# Patient Record
Sex: Female | Born: 1975 | Race: White | Hispanic: No | Marital: Married | Smoking: Former smoker
Health system: Southern US, Community
[De-identification: ages and names within clinical notes are randomized; demographics above are authoritative.]

## PROBLEM LIST (undated history)

## (undated) DIAGNOSIS — F419 Anxiety disorder, unspecified: Secondary | ICD-10-CM

## (undated) HISTORY — DX: Anxiety disorder, unspecified: F41.9

## (undated) HISTORY — PX: BREAST CYST ASPIRATION: SHX578

---

## 1999-09-24 ENCOUNTER — Ambulatory Visit (HOSPITAL_BASED_OUTPATIENT_CLINIC_OR_DEPARTMENT_OTHER): Admission: RE | Admit: 1999-09-24 | Discharge: 1999-09-24 | Payer: Self-pay

## 1999-09-24 ENCOUNTER — Encounter (INDEPENDENT_AMBULATORY_CARE_PROVIDER_SITE_OTHER): Payer: Self-pay | Admitting: *Deleted

## 2001-02-07 ENCOUNTER — Other Ambulatory Visit: Admission: RE | Admit: 2001-02-07 | Discharge: 2001-02-07 | Payer: Self-pay | Admitting: Obstetrics & Gynecology

## 2002-03-14 ENCOUNTER — Other Ambulatory Visit: Admission: RE | Admit: 2002-03-14 | Discharge: 2002-03-14 | Payer: Self-pay | Admitting: Obstetrics & Gynecology

## 2003-03-17 ENCOUNTER — Other Ambulatory Visit: Admission: RE | Admit: 2003-03-17 | Discharge: 2003-03-17 | Payer: Self-pay | Admitting: Obstetrics & Gynecology

## 2004-04-13 ENCOUNTER — Other Ambulatory Visit: Admission: RE | Admit: 2004-04-13 | Discharge: 2004-04-13 | Payer: Self-pay | Admitting: Obstetrics & Gynecology

## 2005-04-27 ENCOUNTER — Other Ambulatory Visit: Admission: RE | Admit: 2005-04-27 | Discharge: 2005-04-27 | Payer: Self-pay | Admitting: Obstetrics & Gynecology

## 2005-10-03 ENCOUNTER — Ambulatory Visit: Payer: Self-pay | Admitting: Family Medicine

## 2006-06-28 ENCOUNTER — Ambulatory Visit: Payer: Self-pay | Admitting: Family Medicine

## 2006-08-14 ENCOUNTER — Ambulatory Visit (HOSPITAL_COMMUNITY): Payer: Self-pay | Admitting: Psychiatry

## 2007-06-01 ENCOUNTER — Encounter: Admission: RE | Admit: 2007-06-01 | Discharge: 2007-06-01 | Payer: Self-pay | Admitting: Obstetrics & Gynecology

## 2007-11-29 ENCOUNTER — Encounter: Admission: RE | Admit: 2007-11-29 | Discharge: 2007-11-29 | Payer: Self-pay | Admitting: Obstetrics & Gynecology

## 2008-05-06 ENCOUNTER — Ambulatory Visit: Payer: Self-pay | Admitting: Family Medicine

## 2008-05-06 DIAGNOSIS — F411 Generalized anxiety disorder: Secondary | ICD-10-CM | POA: Insufficient documentation

## 2008-06-06 ENCOUNTER — Encounter: Admission: RE | Admit: 2008-06-06 | Discharge: 2008-06-06 | Payer: Self-pay | Admitting: Obstetrics & Gynecology

## 2008-06-27 ENCOUNTER — Telehealth (INDEPENDENT_AMBULATORY_CARE_PROVIDER_SITE_OTHER): Payer: Self-pay | Admitting: *Deleted

## 2012-02-01 ENCOUNTER — Ambulatory Visit: Payer: Managed Care, Other (non HMO) | Admitting: Family Medicine

## 2012-02-01 VITALS — BP 112/68 | HR 92 | Temp 98.8°F | Resp 14 | Ht 63.0 in | Wt 128.0 lb

## 2012-02-01 DIAGNOSIS — R21 Rash and other nonspecific skin eruption: Secondary | ICD-10-CM

## 2012-02-01 DIAGNOSIS — N949 Unspecified condition associated with female genital organs and menstrual cycle: Secondary | ICD-10-CM

## 2012-02-01 DIAGNOSIS — L259 Unspecified contact dermatitis, unspecified cause: Secondary | ICD-10-CM

## 2012-02-01 LAB — POCT SKIN KOH: Skin KOH, POC: NEGATIVE

## 2012-02-01 MED ORDER — TRIAMCINOLONE ACETONIDE 0.1 % EX CREA: TOPICAL_CREAM | Freq: Two times a day (BID) | CUTANEOUS | Status: AC

## 2012-02-01 NOTE — Progress Notes (Signed)
Subjective:    Patient ID: Alyssa Obrien, female    DOB: Nov 11, 1975, 36 y.o.   MRN: [id number]  HPIThis 36 y.o. female presents for evaluation of rash along anterior neck.  Onset two weeks ago.  Very stressful three weeks.  Looked like hives at first; now seems to be spreading from neck to facial region.  Intermittent itching.  Heat or lotion makes worse.  Benadryl crea, moisturizer makes worse.  Astringent does not bother it.  No other areas of involvement.  No new sunscreen.  Started with single dry spot.  Then large spots with pustules.  No blisters.  No pain or tenderness.  Feels very dry.  Will puff up.  History of hives but this rash is different; history of Monistat cream use with development of hives.  Several episodes of hives in childhood due to stress per patient.  No new soaps, detergents, shampoos, fabric softener.  No new medications.  No recent illnesses; no tick bites.  No close contacts.  No steroid cream use.  PMH: Anxiety. All: Monistat cream.        Review of Systems  Constitutional: Negative for fever, chills and fatigue.  HENT: Negative for ear pain, congestion, sore throat, rhinorrhea and mouth sores.   Respiratory: Negative for cough.   Gastrointestinal: Negative for nausea and vomiting.  Skin: Positive for rash. Negative for pallor and wound.  Psychiatric/Behavioral: The patient is nervous/anxious.     Past Medical History  Diagnosis Date  . Anxiety     No past surgical history on file.  Prior to Admission medications   Medication Sig Start Date End Date Taking? Authorizing Provider  ALPRAZolam Prudy Feeler) 0.5 MG tablet Take 0.5 mg by mouth at bedtime as needed.   Yes Historical Provider, MD  norgestimate-ethinyl estradiol (ORTHO-CYCLEN,SPRINTEC,PREVIFEM) 0.25-35 MG-MCG tablet Take 1 tablet by mouth daily.   Yes Historical Provider, MD  triamcinolone cream (KENALOG) 0.1 % Apply topically 2 (two) times daily. 02/01/12 01/31/13  Ethelda Chick, MD    No Known  Allergies  History   Social History  . Marital Status: Married    Spouse Name: N/A    Number of Children: N/A  . Years of Education: N/A   Occupational History  . Not on file.   Social History Main Topics  . Smoking status: Former Games developer  . Smokeless tobacco: Not on file  . Alcohol Use: Not on file  . Drug Use: Not on file  . Sexually Active: Not on file   Other Topics Concern  . Not on file   Social History Narrative  . No narrative on file    No family history on file.     Objective:   Physical Exam  Constitutional: She appears well-developed and well-nourished. No distress.  HENT:  Head: Normocephalic and atraumatic.  Eyes: Conjunctivae and EOM are normal. Pupils are equal, round, and reactive to light.  Neck: Normal range of motion. Neck supple. No thyromegaly present.  Cardiovascular: Normal rate and regular rhythm.   Pulmonary/Chest: Effort normal and breath sounds normal.  Lymphadenopathy:    She has no cervical adenopathy.  Skin: Skin is warm and dry. Rash noted. She is not diaphoretic. No pallor.       Anterior neck with scaling rash multiple areas; scattered annular rash with some central clearing; minimal erythema.  Scattered lesions anterior neck; 3 small similar lesions chin, upper lip.  No vesicles or pustules.  Non-tender.  No warmth, induration, streaking.  Psychiatric: She has a  normal mood and affect. Her behavior is normal. Judgment and thought content normal.          Results for orders placed in visit on 02/01/12  POCT SKIN KOH      Component Value Range   Skin KOH, POC Negative     Assessment & Plan:   1. Rash of neck  POCT Skin KOH  2. Dermatitis, contact     New.  Pruritix scaling rash anterior neck.  KOH negative.  Rx for Triamcinolone cream provided to apply bid to tid for next 2-3 weeks; to call office if clinically declines/worsens or spreads.  Local wound care.  Recommend Claritin or Zyrtec 10mg  daily for itching. Meds ordered  this encounter  Medications  . norgestimate-ethinyl estradiol (ORTHO-CYCLEN,SPRINTEC,PREVIFEM) 0.25-35 MG-MCG tablet    Sig: Take 1 tablet by mouth daily.  Marland Kitchen ALPRAZolam (XANAX) 0.5 MG tablet    Sig: Take 0.5 mg by mouth at bedtime as needed.  . triamcinolone cream (KENALOG) 0.1 %    Sig: Apply topically 2 (two) times daily.    Dispense:  30 g    Refill:  0

## 2012-02-01 NOTE — Patient Instructions (Addendum)
1. Rash of neck  POCT Skin KOH  2. Dermatitis, contact    Contact Dermatitis Contact dermatitis is a reaction to certain substances that touch the skin. Contact dermatitis can be either irritant contact dermatitis or allergic contact dermatitis. Irritant contact dermatitis does not require previous exposure to the substance for a reaction to occur.Allergic contact dermatitis only occurs if you have been exposed to the substance before. Upon a repeat exposure, your body reacts to the substance.  CAUSES  Many substances can cause contact dermatitis. Irritant dermatitis is most commonly caused by repeated exposure to mildly irritating substances, such as:  Makeup.   Soaps.   Detergents.   Bleaches.   Acids.   Metal salts, such as nickel.  Allergic contact dermatitis is most commonly caused by exposure to:  Poisonous plants.   Chemicals (deodorants, shampoos).   Jewelry.   Latex.   Neomycin in triple antibiotic cream.   Preservatives in products, including clothing.  SYMPTOMS  The area of skin that is exposed may develop:  Dryness or flaking.   Redness.   Cracks.   Itching.   Pain or a burning sensation.   Blisters.  With allergic contact dermatitis, there may also be swelling in areas such as the eyelids, mouth, or genitals.  DIAGNOSIS  Your caregiver can usually tell what the problem is by doing a physical exam. In cases where the cause is uncertain and an allergic contact dermatitis is suspected, a patch skin test may be performed to help determine the cause of your dermatitis. TREATMENT Treatment includes protecting the skin from further contact with the irritating substance by avoiding that substance if possible. Barrier creams, powders, and gloves may be helpful. Your caregiver may also recommend:  Steroid creams or ointments applied 2 times daily. For best results, soak the rash area in cool water for 20 minutes. Then apply the medicine. Cover the area with a  plastic wrap. You can store the steroid cream in the refrigerator for a "chilly" effect on your rash. That may decrease itching. Oral steroid medicines may be needed in more severe cases.   Antibiotics or antibacterial ointments if a skin infection is present.   Antihistamine lotion or an antihistamine taken by mouth to ease itching.   Lubricants to keep moisture in your skin.   Burow's solution to reduce redness and soreness or to dry a weeping rash. Mix one packet or tablet of solution in 2 cups cool water. Dip a clean washcloth in the mixture, wring it out a bit, and put it on the affected area. Leave the cloth in place for 30 minutes. Do this as often as possible throughout the day.   Taking several cornstarch or baking soda baths daily if the area is too large to cover with a washcloth.  Harsh chemicals, such as alkalis or acids, can cause skin damage that is like a burn. You should flush your skin for 15 to 20 minutes with cold water after such an exposure. You should also seek immediate medical care after exposure. Bandages (dressings), antibiotics, and pain medicine may be needed for severely irritated skin.  HOME CARE INSTRUCTIONS  Avoid the substance that caused your reaction.   Keep the area of skin that is affected away from hot water, soap, sunlight, chemicals, acidic substances, or anything else that would irritate your skin.   Do not scratch the rash. Scratching may cause the rash to become infected.   You may take cool baths to help stop the itching.  Only take over-the-counter or prescription medicines as directed by your caregiver.   See your caregiver for follow-up care as directed to make sure your skin is healing properly.  SEEK MEDICAL CARE IF:   Your condition is not better after 3 days of treatment.   You seem to be getting worse.   You see signs of infection such as swelling, tenderness, redness, soreness, or warmth in the affected area.   You have any  problems related to your medicines.  Document Released: 06/03/2000 Document Revised: 05/26/2011 Document Reviewed: 11/09/2010 [facility] Patient Information 2012 [place], [place].

## 2012-02-05 ENCOUNTER — Encounter: Payer: Self-pay | Admitting: Family Medicine

## 2012-02-10 NOTE — Progress Notes (Signed)
Reviewed and agree.

## 2013-09-04 ENCOUNTER — Other Ambulatory Visit: Payer: Self-pay | Admitting: Obstetrics & Gynecology

## 2013-09-04 DIAGNOSIS — R928 Other abnormal and inconclusive findings on diagnostic imaging of breast: Secondary | ICD-10-CM

## 2013-09-16 ENCOUNTER — Other Ambulatory Visit: Payer: Self-pay | Admitting: Obstetrics & Gynecology

## 2013-09-16 ENCOUNTER — Ambulatory Visit
Admission: RE | Admit: 2013-09-16 | Discharge: 2013-09-16 | Disposition: A | Discharge status: Home / Self Care | Payer: Managed Care, Other (non HMO) | Source: Ambulatory Visit | Attending: Obstetrics & Gynecology | Admitting: Obstetrics & Gynecology

## 2013-09-16 DIAGNOSIS — N63 Unspecified lump in unspecified breast: Secondary | ICD-10-CM

## 2013-09-16 DIAGNOSIS — R928 Other abnormal and inconclusive findings on diagnostic imaging of breast: Secondary | ICD-10-CM

## 2013-09-20 ENCOUNTER — Other Ambulatory Visit: Payer: Self-pay | Admitting: Obstetrics & Gynecology

## 2013-09-20 ENCOUNTER — Ambulatory Visit
Admission: RE | Admit: 2013-09-20 | Discharge: 2013-09-20 | Disposition: A | Discharge status: Home / Self Care | Payer: Managed Care, Other (non HMO) | Source: Ambulatory Visit | Attending: Obstetrics & Gynecology | Admitting: Obstetrics & Gynecology

## 2013-09-20 DIAGNOSIS — N6001 Solitary cyst of right breast: Secondary | ICD-10-CM

## 2013-09-20 DIAGNOSIS — N63 Unspecified lump in unspecified breast: Secondary | ICD-10-CM

## 2013-10-01 ENCOUNTER — Other Ambulatory Visit: Payer: Self-pay | Admitting: Dermatology

## 2014-10-08 ENCOUNTER — Other Ambulatory Visit: Payer: Self-pay | Admitting: Obstetrics & Gynecology

## 2014-10-10 ENCOUNTER — Other Ambulatory Visit: Payer: Self-pay | Admitting: Obstetrics & Gynecology

## 2014-10-10 DIAGNOSIS — R928 Other abnormal and inconclusive findings on diagnostic imaging of breast: Secondary | ICD-10-CM

## 2014-10-10 LAB — CYTOLOGY - PAP

## 2014-10-21 ENCOUNTER — Ambulatory Visit
Admission: RE | Admit: 2014-10-21 | Discharge: 2014-10-21 | Disposition: A | Discharge status: Home / Self Care | Payer: Managed Care, Other (non HMO) | Source: Ambulatory Visit | Attending: Obstetrics & Gynecology | Admitting: Obstetrics & Gynecology

## 2014-10-21 DIAGNOSIS — R928 Other abnormal and inconclusive findings on diagnostic imaging of breast: Secondary | ICD-10-CM

## 2015-10-27 ENCOUNTER — Other Ambulatory Visit: Payer: Self-pay | Admitting: Obstetrics & Gynecology

## 2015-10-27 DIAGNOSIS — R928 Other abnormal and inconclusive findings on diagnostic imaging of breast: Secondary | ICD-10-CM

## 2015-11-04 ENCOUNTER — Ambulatory Visit
Admission: RE | Admit: 2015-11-04 | Discharge: 2015-11-04 | Disposition: A | Discharge status: Home / Self Care | Payer: Managed Care, Other (non HMO) | Source: Ambulatory Visit | Attending: Obstetrics & Gynecology | Admitting: Obstetrics & Gynecology

## 2015-11-04 DIAGNOSIS — R928 Other abnormal and inconclusive findings on diagnostic imaging of breast: Secondary | ICD-10-CM

## 2019-05-27 ENCOUNTER — Other Ambulatory Visit: Payer: Self-pay | Admitting: Obstetrics & Gynecology

## 2019-05-27 DIAGNOSIS — R928 Other abnormal and inconclusive findings on diagnostic imaging of breast: Secondary | ICD-10-CM

## 2019-06-04 ENCOUNTER — Other Ambulatory Visit: Payer: Self-pay

## 2019-06-04 ENCOUNTER — Other Ambulatory Visit: Payer: Self-pay | Admitting: Obstetrics & Gynecology

## 2019-06-04 ENCOUNTER — Ambulatory Visit
Admission: RE | Admit: 2019-06-04 | Discharge: 2019-06-04 | Disposition: A | Discharge status: Home / Self Care | Payer: 59 | Source: Ambulatory Visit | Attending: Obstetrics & Gynecology | Admitting: Obstetrics & Gynecology

## 2019-06-04 ENCOUNTER — Ambulatory Visit
Admission: RE | Admit: 2019-06-04 | Discharge: 2019-06-04 | Disposition: A | Discharge status: Home / Self Care | Payer: Managed Care, Other (non HMO) | Source: Ambulatory Visit | Attending: Obstetrics & Gynecology | Admitting: Obstetrics & Gynecology

## 2019-06-04 DIAGNOSIS — N6310 Unspecified lump in the right breast, unspecified quadrant: Secondary | ICD-10-CM

## 2019-06-04 DIAGNOSIS — R928 Other abnormal and inconclusive findings on diagnostic imaging of breast: Secondary | ICD-10-CM

## 2019-12-04 ENCOUNTER — Ambulatory Visit
Admission: RE | Admit: 2019-12-04 | Discharge: 2019-12-04 | Disposition: A | Discharge status: Home / Self Care | Payer: 59 | Source: Ambulatory Visit | Attending: Obstetrics & Gynecology | Admitting: Obstetrics & Gynecology

## 2019-12-04 ENCOUNTER — Other Ambulatory Visit: Payer: Self-pay

## 2019-12-04 ENCOUNTER — Other Ambulatory Visit: Payer: Self-pay | Admitting: Obstetrics & Gynecology

## 2019-12-04 DIAGNOSIS — N6310 Unspecified lump in the right breast, unspecified quadrant: Secondary | ICD-10-CM

## 2020-06-05 ENCOUNTER — Other Ambulatory Visit: Payer: Self-pay

## 2020-06-05 ENCOUNTER — Ambulatory Visit
Admission: RE | Admit: 2020-06-05 | Discharge: 2020-06-05 | Disposition: A | Discharge status: Home / Self Care | Payer: No Typology Code available for payment source | Source: Ambulatory Visit | Attending: Obstetrics & Gynecology | Admitting: Obstetrics & Gynecology

## 2020-06-05 DIAGNOSIS — N6310 Unspecified lump in the right breast, unspecified quadrant: Secondary | ICD-10-CM

## 2021-09-10 ENCOUNTER — Other Ambulatory Visit: Payer: Self-pay | Admitting: Obstetrics & Gynecology

## 2021-09-10 DIAGNOSIS — N6310 Unspecified lump in the right breast, unspecified quadrant: Secondary | ICD-10-CM

## 2021-09-27 ENCOUNTER — Ambulatory Visit
Admission: RE | Admit: 2021-09-27 | Discharge: 2021-09-27 | Disposition: A | Discharge status: Home / Self Care | Payer: No Typology Code available for payment source | Source: Ambulatory Visit | Attending: Obstetrics & Gynecology | Admitting: Obstetrics & Gynecology

## 2021-09-27 DIAGNOSIS — N6310 Unspecified lump in the right breast, unspecified quadrant: Secondary | ICD-10-CM

## 2022-05-24 IMAGING — MG DIGITAL DIAGNOSTIC BILAT W/ TOMO W/ CAD
6 of 10 series · 6 of 30 positions shown · non-contrast
Comparison: Previous exam(s).

CLINICAL DATA: Follow-up probably benign mass in the 10 o'clock
position of the right breast. History of benign breast cysts.

EXAM:
DIGITAL DIAGNOSTIC BILATERAL MAMMOGRAM WITH TOMOSYNTHESIS AND CAD;
ULTRASOUND RIGHT BREAST LIMITED
TECHNIQUE: Bilateral digital diagnostic mammography and breast tomosynthesis
was performed. The images were evaluated with computer-aided
detection.; Targeted ultrasound examination of the right breast was
performed

[R TAN synth-2D]
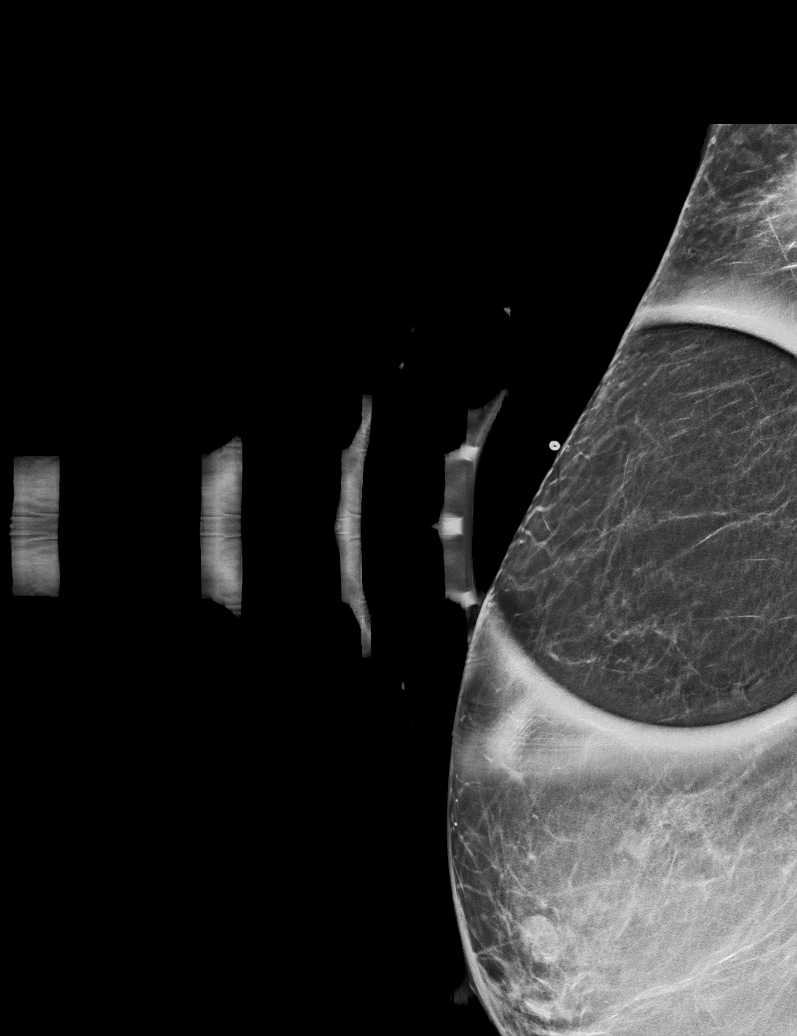

[R CC synth-2D]
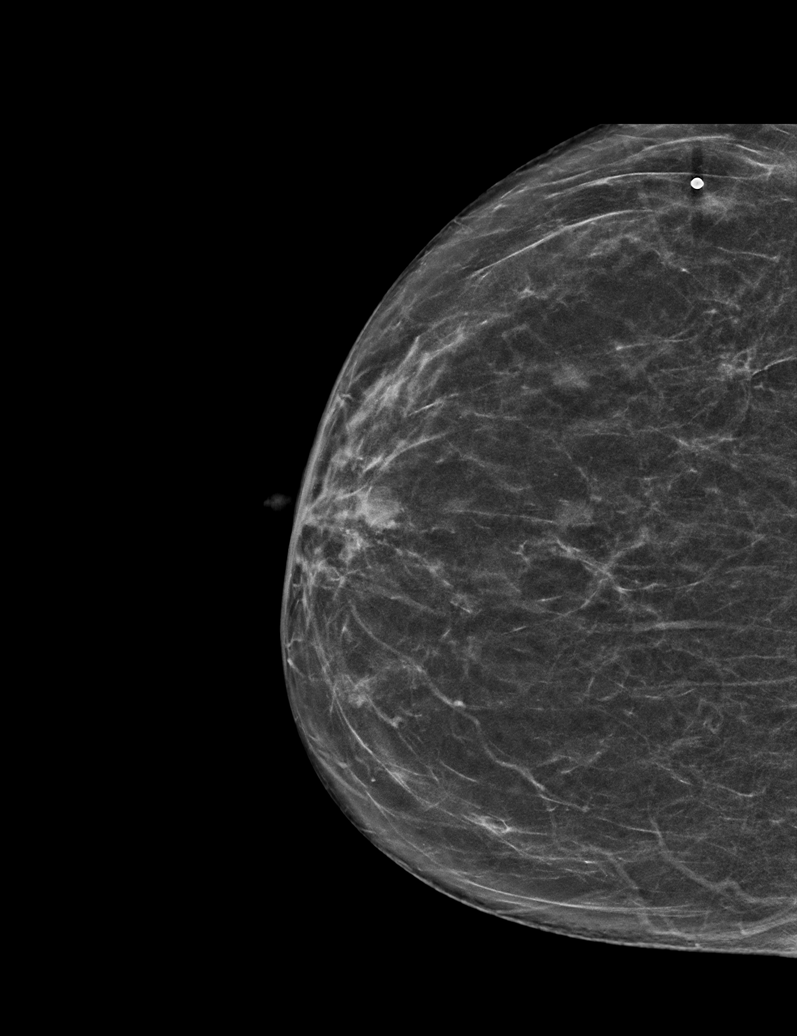

[L MLO synth-2D]
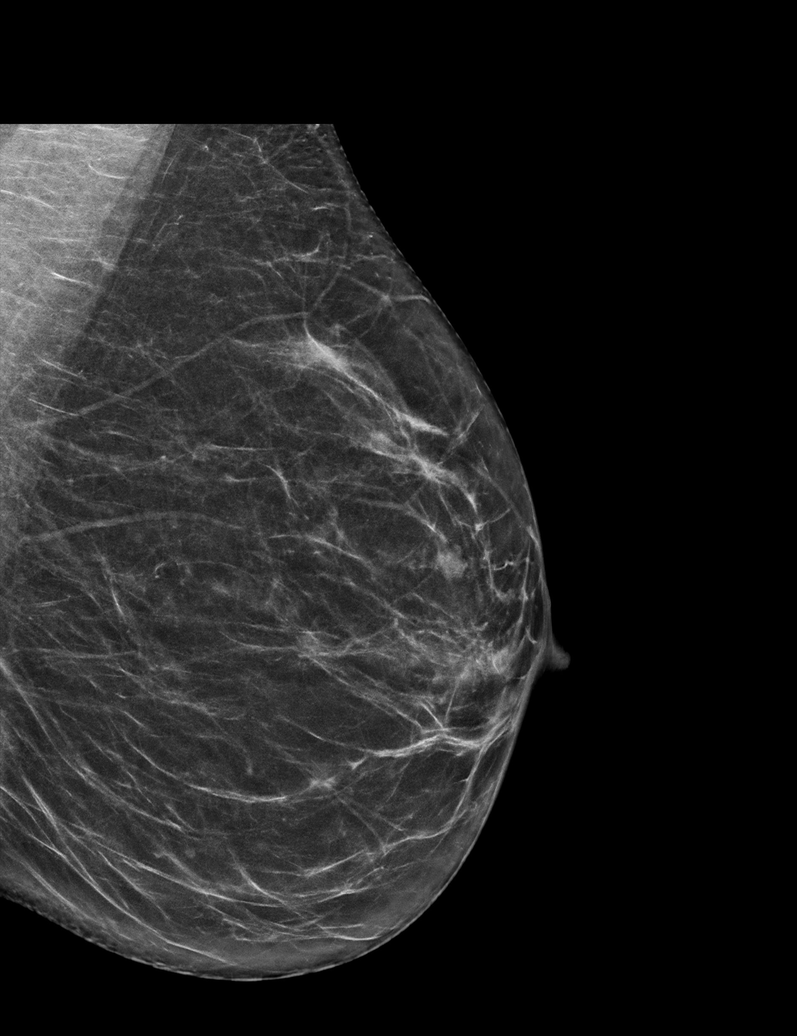

[L CC synth-2D]
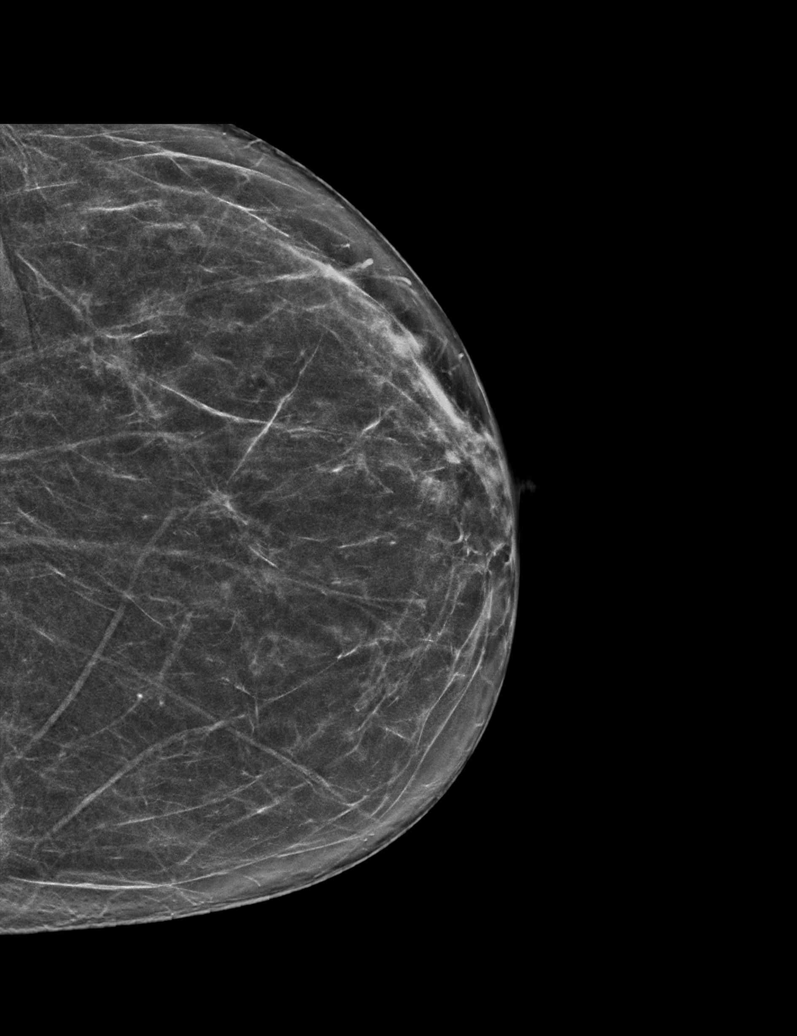

[R MLO synth-2D]
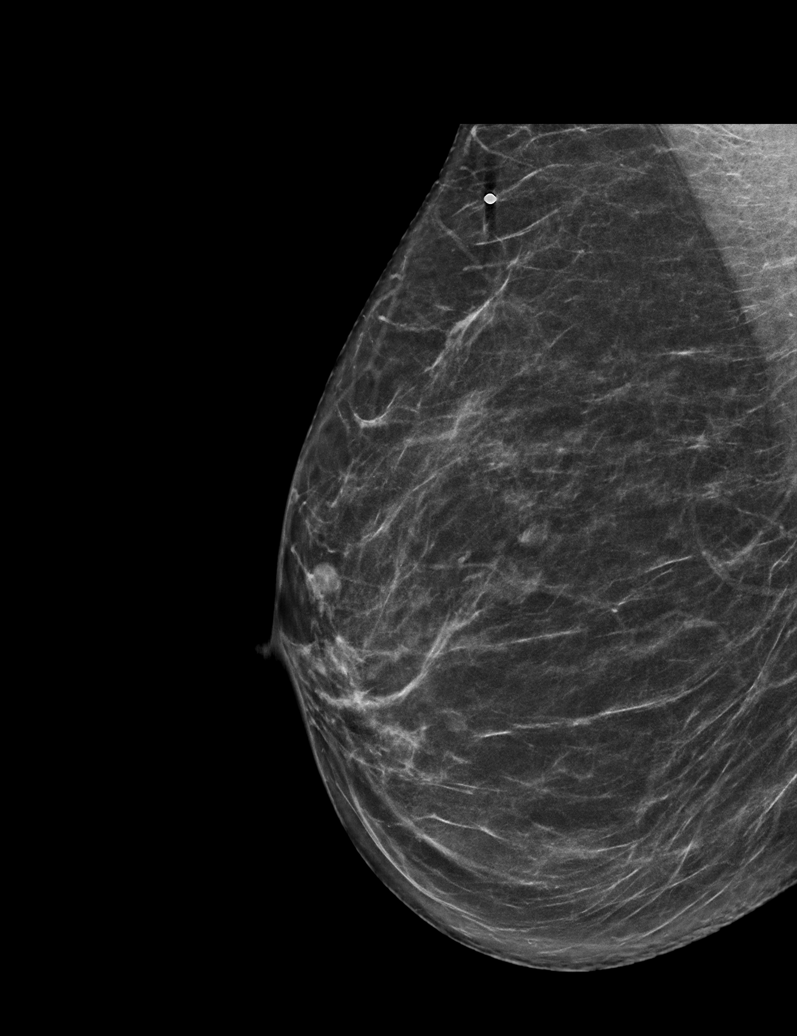

[R TAN tomo · tomo slice 29/56.0]
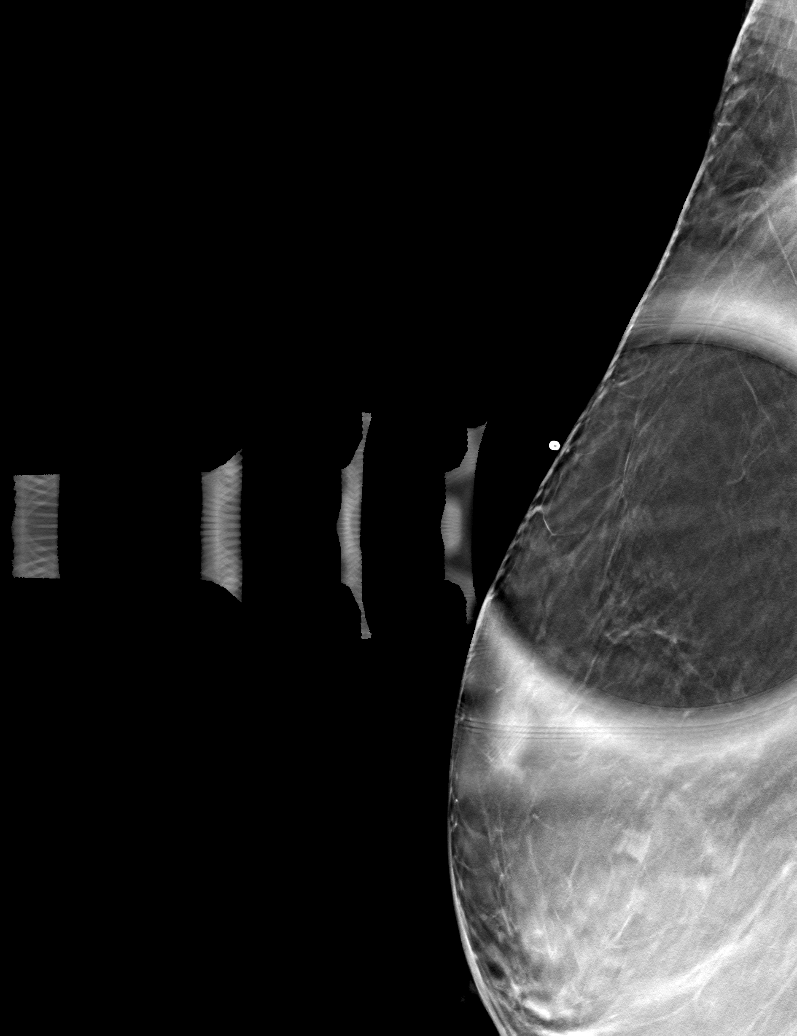

[6 of 30 positions shown; findings below may reference images not displayed]

ACR Breast Density Category b: There are scattered areas of
fibroglandular density.
FINDINGS: Multiple small, oval, circumscribed waxing and waning masses are
again demonstrated in both breasts. One of these, previously
demonstrated in the posterior upper outer right breast is no longer
visualized. No mass or other findings suspicious for malignancy at
the location of patient palpable concern on the right, marked with a
metallic marker. No interval findings elsewhere in either breast
suspicious for malignancy.

On physical exam, there is an approximately 1.2 x 0.7 cm oval,
circumscribed, somewhat compressible mass in the posterior upper
outer right breast at the location of patient palpable concern.

Targeted ultrasound is performed, showing a normal appearing
subcutaneous fat lobule at the location of patient palpable concern,
corresponding to the palpable mass.

The previously demonstrated probably benign mass in the 10 o'clock
position of the right breast, 6 cm from the nipple, is no longer
visualized.
IMPRESSION: 1. Resolution of the previously demonstrated probably benign mass in
the 10 o'clock position of the right breast, compatible with
resolution of a benign, complicated cyst.
2. Normal appearing subcutaneous fat lobule corresponding to the
palpable mass felt by the patient in the upper outer right breast.
3. No evidence of malignancy in either breast.

RECOMMENDATION:
Bilateral screening mammogram in 1 year.

I have discussed the findings and recommendations with the patient.
If applicable, a reminder letter will be sent to the patient
regarding the next appointment.

BI-RADS CATEGORY  2: Benign.

## 2022-08-08 ENCOUNTER — Other Ambulatory Visit: Payer: Self-pay | Admitting: Obstetrics and Gynecology

## 2022-08-08 DIAGNOSIS — R928 Other abnormal and inconclusive findings on diagnostic imaging of breast: Secondary | ICD-10-CM

## 2022-08-19 ENCOUNTER — Ambulatory Visit
Admission: RE | Admit: 2022-08-19 | Discharge: 2022-08-19 | Disposition: A | Discharge status: Home / Self Care | Payer: No Typology Code available for payment source | Source: Ambulatory Visit | Attending: Obstetrics and Gynecology | Admitting: Obstetrics and Gynecology

## 2022-08-19 DIAGNOSIS — R928 Other abnormal and inconclusive findings on diagnostic imaging of breast: Secondary | ICD-10-CM

## 2022-10-05 ENCOUNTER — Encounter: Payer: Self-pay | Admitting: Obstetrics and Gynecology

## 2023-08-31 ENCOUNTER — Other Ambulatory Visit: Payer: Self-pay | Admitting: Obstetrics and Gynecology

## 2023-08-31 DIAGNOSIS — R928 Other abnormal and inconclusive findings on diagnostic imaging of breast: Secondary | ICD-10-CM

## 2023-09-12 ENCOUNTER — Ambulatory Visit
Admission: RE | Admit: 2023-09-12 | Discharge: 2023-09-12 | Disposition: A | Discharge status: Home / Self Care | Source: Ambulatory Visit | Attending: Obstetrics and Gynecology | Admitting: Obstetrics and Gynecology

## 2023-09-12 DIAGNOSIS — R928 Other abnormal and inconclusive findings on diagnostic imaging of breast: Secondary | ICD-10-CM

## 2023-10-16 ENCOUNTER — Encounter: Payer: Self-pay | Admitting: Internal Medicine

## 2023-10-25 ENCOUNTER — Encounter (HOSPITAL_COMMUNITY): Payer: Self-pay

## 2023-11-01 ENCOUNTER — Encounter

## 2023-11-15 ENCOUNTER — Encounter: Admitting: Internal Medicine
